# Patient Record
Sex: Male | Born: 1995
Health system: Southern US, Community
[De-identification: ages and names within clinical notes are randomized; demographics above are authoritative.]

## PROBLEM LIST (undated history)

## (undated) DIAGNOSIS — R630 Anorexia: Secondary | ICD-10-CM

## (undated) DIAGNOSIS — R6250 Unspecified lack of expected normal physiological development in childhood: Secondary | ICD-10-CM

## (undated) DIAGNOSIS — E063 Autoimmune thyroiditis: Secondary | ICD-10-CM

## (undated) DIAGNOSIS — E230 Hypopituitarism: Secondary | ICD-10-CM

## (undated) DIAGNOSIS — E079 Disorder of thyroid, unspecified: Secondary | ICD-10-CM

## (undated) DIAGNOSIS — F909 Attention-deficit hyperactivity disorder, unspecified type: Secondary | ICD-10-CM

## (undated) HISTORY — DX: Attention-deficit hyperactivity disorder, unspecified type: F90.9

## (undated) HISTORY — DX: Disorder of thyroid, unspecified: E07.9

## (undated) HISTORY — DX: Unspecified lack of expected normal physiological development in childhood: R62.50

## (undated) HISTORY — DX: Autoimmune thyroiditis: E06.3

## (undated) HISTORY — DX: Hypopituitarism: E23.0

## (undated) HISTORY — DX: Anorexia: R63.0

---
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
## 2014-04-23 IMAGING — CR DG BONE AGE
1 series · 1 of 1 positions shown · non-contrast
Comparison: 11/24/2010

CLINICAL DATA: Growth delay.

BONE AGE DETERMINATION
TECHNIQUE: AP radiographs of the hand and wrist are correlated
with the developmental standards of Greulich and Pyle.

[view not recorded]
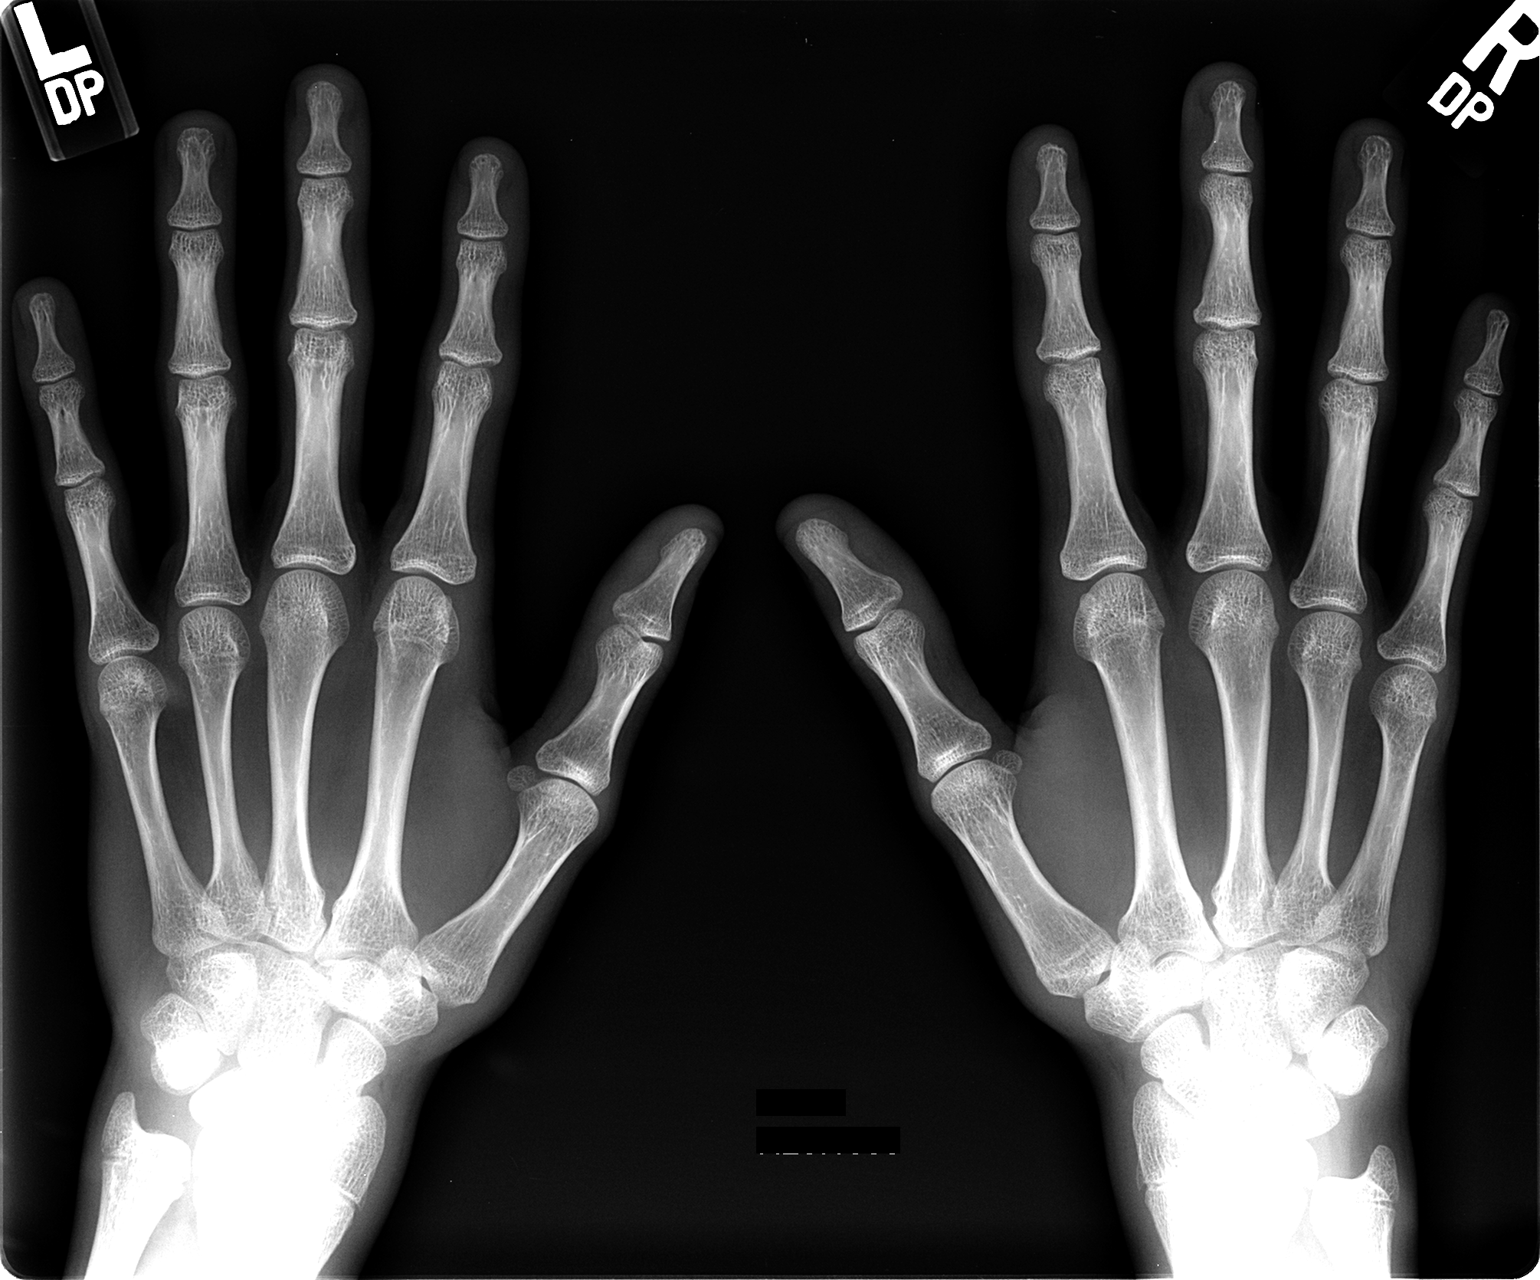

[1 of 1 positions shown; findings below may reference images not displayed]

FINDINGS: Chronologic age:  16  years, 2 months (date of birth
08/16/1995)

months
IMPRESSION: Skeletal age within a standard deviation of the chronologic age.
The only growth plates that remain open are in the distal radius
and ulna.

## 2019-10-15 ENCOUNTER — Ambulatory Visit: Payer: Self-pay

## 2019-10-15 ENCOUNTER — Other Ambulatory Visit: Payer: Self-pay

## 2019-10-15 ENCOUNTER — Ambulatory Visit: Payer: BC Managed Care – PPO | Admitting: Physician Assistant

## 2019-10-15 VITALS — BP 114/69 | HR 60 | Resp 18 | Ht 74.5 in | Wt 136.0 lb

## 2019-10-15 DIAGNOSIS — Z114 Encounter for screening for human immunodeficiency virus [HIV]: Secondary | ICD-10-CM

## 2019-10-15 DIAGNOSIS — Z8639 Personal history of other endocrine, nutritional and metabolic disease: Secondary | ICD-10-CM

## 2019-10-15 DIAGNOSIS — Z8659 Personal history of other mental and behavioral disorders: Secondary | ICD-10-CM

## 2019-10-15 DIAGNOSIS — Z1159 Encounter for screening for other viral diseases: Secondary | ICD-10-CM

## 2019-10-15 DIAGNOSIS — Z0000 Encounter for general adult medical examination without abnormal findings: Secondary | ICD-10-CM

## 2019-10-15 NOTE — Progress Notes (Signed)
New Patient Office Visit  Subjective:  Patient ID: Justin Reyes, male    DOB: 1996/01/05  Age: 24 y.o. MRN: [id number]  CC:  Chief Complaint  Patient presents with  . ADD    HPI Justin Reyes reports that he was treated for attention deficit as a child, but he has not been on occasion approximately 2012.  Reports that has noticed that he is having difficult quality focusing at work and does request to restart his medication.  Reports sleep is good, does participate in some exercise on the weekends, has coffee in the morning but otherwise drinks mostly water during the day.  Does endorse that his diet could be better, generally eats fast food for lunch every day.  Reports history of Hashimoto's thyroid, states that he never had to take medication for this.  States that he feels it was due to growth hormone therapy.  No other concerns at this time  Does vape  Has had one shot Phizer Covid vaccine 05/26/2019   Past Medical History:  Diagnosis Date  . ADHD (attention deficit hyperactivity disorder)   . Growth hormone deficiency (human) (HCC)   . Hypothyroidism, acquired, autoimmune   . Physical growth delay   . Poor appetite   . Thyroiditis, autoimmune     History reviewed. No pertinent surgical history.  Family History  Problem Relation Age of Onset  . Thyroid disease Mother   . Thyroid disease Maternal Aunt   . Cancer Maternal Grandmother   . Thyroid disease Maternal Grandfather   . Diabetes Neg Hx   . Kidney disease Neg Hx     Social History   Socioeconomic History  . Marital status: Single    Spouse name: Not on file  . Number of children: Not on file  . Years of education: Not on file  . Highest education level: Not on file  Occupational History  . Not on file  Tobacco Use  . Smoking status: Never Smoker  . Smokeless tobacco: Never Used  Substance and Sexual Activity  . Alcohol use: No  . Drug use: No  . Sexual activity: Never  Other Topics  Concern  . Not on file  Social History Narrative  . Not on file   Social Determinants of Health   Financial Resource Strain:   . Difficulty of Paying Living Expenses: Not on file  Food Insecurity:   . Worried About Programme researcher, broadcasting/film/video in the Last Year: Not on file  . Ran Out of Food in the Last Year: Not on file  Transportation Needs:   . Lack of Transportation (Medical): Not on file  . Lack of Transportation (Non-Medical): Not on file  Physical Activity:   . Days of Exercise per Week: Not on file  . Minutes of Exercise per Session: Not on file  Stress:   . Feeling of Stress : Not on file  Social Connections:   . Frequency of Communication with Friends and Family: Not on file  . Frequency of Social Gatherings with Friends and Family: Not on file  . Attends Religious Services: Not on file  . Active Member of Clubs or Organizations: Not on file  . Attends Banker Meetings: Not on file  . Marital Status: Not on file  Intimate Partner Violence:   . Fear of Current or Ex-Partner: Not on file  . Emotionally Abused: Not on file  . Physically Abused: Not on file  . Sexually Abused: Not on file  ROS Review of Systems  Constitutional: Negative.   HENT: Negative.   Eyes: Negative.   Respiratory: Negative.   Cardiovascular: Negative.   Gastrointestinal: Negative.  Negative for abdominal pain and constipation.  Endocrine: Negative.   Genitourinary: Negative.   Musculoskeletal: Negative.   Skin: Negative.   Allergic/Immunologic: Negative.   Neurological: Negative.  Negative for dizziness and headaches.  Hematological: Negative.   Psychiatric/Behavioral: Negative for dysphoric mood, self-injury, sleep disturbance and suicidal ideas. The patient is not nervous/anxious.     Objective:   Today's Vitals: BP 114/69 (BP Location: Left Arm, Patient Position: Sitting, Cuff Size: Normal)   Pulse 60   Resp 18   Ht 6' 2.5" (1.892 m)   Wt 136 lb (61.7 kg)   SpO2 100%    BMI 17.23 kg/m   Physical Exam Vitals and nursing note reviewed.  Constitutional:      Appearance: Normal appearance. He is normal weight.  HENT:     Head: Normocephalic and atraumatic.     Right Ear: Tympanic membrane, ear canal and external ear normal.     Left Ear: Tympanic membrane, ear canal and external ear normal.     Nose: Nose normal.     Mouth/Throat:     Mouth: Mucous membranes are moist.     Pharynx: Oropharynx is clear.  Eyes:     Extraocular Movements: Extraocular movements intact.     Conjunctiva/sclera: Conjunctivae normal.     Pupils: Pupils are equal, round, and reactive to light.  Cardiovascular:     Rate and Rhythm: Normal rate and regular rhythm.     Pulses: Normal pulses.     Heart sounds: Normal heart sounds.  Pulmonary:     Effort: Pulmonary effort is normal.     Breath sounds: Normal breath sounds.  Abdominal:     General: Abdomen is flat. Bowel sounds are normal.     Palpations: Abdomen is soft.  Musculoskeletal:        General: Normal range of motion.     Cervical back: Normal range of motion and neck supple.  Skin:    General: Skin is warm and dry.  Neurological:     General: No focal deficit present.     Mental Status: He is alert and oriented to person, place, and time.  Psychiatric:        Mood and Affect: Mood normal.        Behavior: Behavior normal.        Thought Content: Thought content normal.        Judgment: Judgment normal.     Assessment & Plan:   Problem List Items Addressed This Visit    None    Visit Diagnoses    Wellness examination    -  Primary   Relevant Orders   CBC with Differential/Platelet   Comp. Metabolic Panel (12)   History of ADHD       Relevant Orders   Ambulatory referral to Psychiatry   History of Hashimoto thyroiditis       Relevant Orders   TSH   Screening for HIV without presence of risk factors       Relevant Orders   HIV antibody (with reflex)   Encounter for HCV screening test for low  risk patient       Relevant Orders   HCV Ab w/Rflx to Verification      Outpatient Encounter Medications as of 10/15/2019  Medication Sig  . amphetamine-dextroamphetamine (ADDERALL) 15 MG tablet Take  15 mg by mouth daily.    . Multiple Vitamin (MULTIVITAMIN) tablet Take 1 tablet by mouth daily.    . Somatropin (NORDITROPIN NORDIFLEX PEN [place]) Inject 5.9 mg into the skin daily.  (Patient not taking: Reported on 10/15/2019)   No facility-administered encounter medications on file as of 10/15/2019.  1. Wellness examination Patient given appointment to establish care with Dr. Earlene Plater at [facility] with Differential/Platelet - Comp. Metabolic Panel (12)  2. History of ADHD Patient education given on lifestyle modifications to help with attention difficulties, including increasing exercise, daily vitamin, maintaining schedule, maintaining good sleep hygiene.  Referral to psychiatry for further evaluation - Ambulatory referral to Psychiatry  3. History of Hashimoto thyroiditis  - TSH  4. Screening for HIV without presence of risk factors  - HIV antibody (with reflex)  5. Encounter for HCV screening test for low risk patient  - HCV Ab w/Rflx to Verification Patient education given on where he can go to receive second dose of Pfizer vaccine   I have reviewed the patient's medical history (PMH, PSH, Social History, Family History, Medications, and allergies) , and have been updated if relevant. I spent 30 minutes reviewing chart and  face to face time with patient.     Follow-up: Return if symptoms worsen or fail to improve.   Kasandra Knudsen Mayers, PA-C

## 2019-10-15 NOTE — Patient Instructions (Signed)
Thank you for allowing [place] to take care of you today.  We will call you with your lab results.  Please let us know if there is anything else we can do for you  Roney Jaffe, PA-C Physician Assistant [facility] [email or web address]    Attention Deficit Hyperactivity Disorder, Adult Attention deficit hyperactivity disorder (ADHD) is a mental health disorder that starts during childhood (neurodevelopmental disorder). For many people with ADHD, the disorder continues into the adult years. Treatment can help you manage your symptoms. What are the causes? The exact cause of ADHD is not known. Most experts believe genetics and environmental factors contribute to ADHD. What increases the risk? The following factors may make you more likely to develop this condition:  Having a family history of ADHD.  Being male.  Being born to a mother who smoked or drank alcohol during pregnancy.  Being exposed to lead or other toxins in the womb or early in life.  Being born before 37 weeks of pregnancy (prematurely) or at a low birth weight.  Having experienced a brain injury. What are the signs or symptoms? Symptoms of this condition depend on the type of ADHD. The two main types are inattentive and hyperactive-impulsive. Some people may have symptoms of both types. Symptoms of the inattentive type include:  Difficulty paying attention.  Making careless mistakes.  Not following instructions.  Being disorganized.  Avoiding tasks that require time and attention.  Losing and forgetting things.  Being easily distracted. Symptoms of the hyperactive-impulsive type include:  Restlessness.  Talking too much.  Interrupting.  Difficulty with: ? Sitting still. ? Feeling motivated. ? Relaxing. ? Waiting in line or waiting for a turn. In adults, this condition may lead to certain problems, such as:  Keeping jobs.  Performing tasks  at work.  Having stable relationships.  Being on time or keeping to a schedule. How is this diagnosed? This condition is diagnosed based on your current symptoms and your history of symptoms. The diagnosis can be made by a health care provider such as a primary care provider or a mental health care specialist. Your health care provider may use a symptom checklist or a behavior rating scale to evaluate your symptoms. He or she may also want to talk with people who have observed your behaviors throughout your life. How is this treated? This condition can be treated with medicines and behavior therapy. Medicines may be the best option to reduce impulsive behaviors and improve attention. Your health care provider may recommend:  Stimulant medicines. These are the most common medicines used for adult ADHD. They affect certain chemicals in the brain (neurotransmitters) and improve your ability to control your symptoms.  A non-stimulant medicine for adult ADHD (atomoxetine). This medicine increases a neurotransmitter called norepinephrine. It may take weeks to months to see effects from this medicine. Counseling and behavioral management are also important for treating ADHD. Counseling is often used along with medicine. Your health care provider may suggest:  Cognitive behavioral therapy (CBT). This type of therapy teaches you to replace negative thoughts and actions with positive thoughts and actions. When used as part of ADHD treatment, this therapy may also include: ? Coping strategies for organization, time management, impulse control, and stress reduction. ? Mindfulness and meditation training.  Behavioral management. You may work with a Psychologist, occupational who is specially trained to help people with ADHD manage and organize activities and function more effectively. Follow these instructions at home: Medicines   Take over-the-counter and  prescription medicines only as told by your health care  provider.  Talk with your health care provider about the possible side effects of your medicines and how to manage them. Lifestyle   Do not use drugs.  Do not drink alcohol if: ? Your health care provider tells you not to drink. ? You are pregnant, may be pregnant, or are planning to become pregnant.  If you drink alcohol: ? Limit how much you use to:  0-1 drink a day for women.  0-2 drinks a day for men. ? Be aware of how much alcohol is in your drink. In the [place] one drink equals one 12 oz bottle of beer (355 mL), one 5 oz glass of wine (148 mL), or one 1 oz glass of hard liquor (44 mL).  Get enough sleep.  Eat a healthy diet.  Exercise regularly. Exercise can help to reduce stress and anxiety. General instructions  Learn as much as you can about adult ADHD, and work closely with your health care providers to find the treatments that work best for you.  Follow the same schedule each day.  Use reminder devices like notes, calendars, and phone apps to stay on time and organized.  Keep all follow-up visits as told by your health care provider and therapist. This is important. Where to find more information A health care provider may be able to recommend resources that are available online or over the phone. You could start with:  Attention Deficit Disorder Association (ADDA): http://davis-dillon.net/  [employer] of Mental Health [facility]): http://www.maynard.net/ Contact a health care provider if:  Your symptoms continue to cause problems.  You have side effects from your medicine, such as: ? Repeated muscle twitches, coughing, or speech outbursts. ? Sleep problems. ? Loss of appetite. ? Dizziness. ? Unusually fast heartbeat. ? Stomach pains. ? Headaches.  You are struggling with anxiety, depression, or substance abuse. Get help right away if you:  Have a severe reaction to a medicine. If you ever feel like you may hurt yourself or others, or have thoughts about taking your  own life, get help right away. You can go to the nearest emergency department or call:  Your local emergency services (911 in the [place]  A suicide crisis helpline, such as the National Suicide Prevention Lifeline at 308-453-9928. This is open 24 hours a day. Summary  ADHD is a mental health disorder that starts during childhood (neurodevelopmental disorder) and often continues into the adult years.  The exact cause of ADHD is not known. Most experts believe genetics and environmental factors contribute to ADHD.  There is no cure for ADHD, but treatment with medicine, cognitive behavioral therapy, or behavioral management can help you manage your condition. This information is not intended to replace advice given to you by your health care provider. Make sure you discuss any questions you have with your health care provider. Document Revised: 06/05/2018 Document Reviewed: 06/05/2018 Elsevier Patient Education  2020 [employer].

## 2019-10-16 ENCOUNTER — Telehealth: Payer: Self-pay | Admitting: *Deleted

## 2019-10-16 LAB — TSH: TSH: 3.16 u[IU]/mL (ref 0.450–4.500)

## 2019-10-16 LAB — CBC WITH DIFFERENTIAL/PLATELET
Basophils Absolute: 0.1 10*3/uL (ref 0.0–0.2)
Basos: 1 %
EOS (ABSOLUTE): 0.2 10*3/uL (ref 0.0–0.4)
Eos: 4 %
Hematocrit: 44.5 % (ref 37.5–51.0)
Hemoglobin: 14.7 g/dL (ref 13.0–17.7)
Immature Grans (Abs): 0 10*3/uL (ref 0.0–0.1)
Immature Granulocytes: 0 %
Lymphocytes Absolute: 1.6 10*3/uL (ref 0.7–3.1)
Lymphs: 34 %
MCH: 29.5 pg (ref 26.6–33.0)
MCHC: 33 g/dL (ref 31.5–35.7)
MCV: 89 fL (ref 79–97)
Monocytes Absolute: 0.4 10*3/uL (ref 0.1–0.9)
Monocytes: 7 %
Neutrophils Absolute: 2.5 10*3/uL (ref 1.4–7.0)
Neutrophils: 54 %
Platelets: 214 10*3/uL (ref 150–450)
RBC: 4.99 x10E6/uL (ref 4.14–5.80)
RDW: 12.3 % (ref 11.6–15.4)
WBC: 4.7 10*3/uL (ref 3.4–10.8)

## 2019-10-16 LAB — COMP. METABOLIC PANEL (12)
AST: 21 IU/L (ref 0–40)
Albumin/Globulin Ratio: 2.1 (ref 1.2–2.2)
Albumin: 4.6 g/dL (ref 4.1–5.2)
Alkaline Phosphatase: 34 IU/L — ABNORMAL LOW (ref 44–121)
BUN/Creatinine Ratio: 11 (ref 9–20)
BUN: 10 mg/dL (ref 6–20)
Bilirubin Total: 0.3 mg/dL (ref 0.0–1.2)
Calcium: 9.8 mg/dL (ref 8.7–10.2)
Chloride: 105 mmol/L (ref 96–106)
Creatinine, Ser: 0.91 mg/dL (ref 0.76–1.27)
GFR calc Af Amer: 134 mL/min/{1.73_m2} (ref >59)
GFR calc non Af Amer: 116 mL/min/{1.73_m2} (ref >59)
Globulin, Total: 2.2 g/dL (ref 1.5–4.5)
Glucose: 87 mg/dL (ref 65–99)
Potassium: 4.7 mmol/L (ref 3.5–5.2)
Sodium: 143 mmol/L (ref 134–144)
Total Protein: 6.8 g/dL (ref 6.0–8.5)

## 2019-10-16 LAB — HCV INTERPRETATION

## 2019-10-16 LAB — HIV ANTIBODY (ROUTINE TESTING W REFLEX): HIV Screen 4th Generation wRfx: NONREACTIVE

## 2019-10-16 LAB — HCV AB W/RFLX TO VERIFICATION: HCV Ab: <0.1 s/co ratio (ref 0.0–0.9)

## 2019-10-16 NOTE — Telephone Encounter (Signed)
-----   Message from Roney Jaffe, [place] sent at 10/16/2019  3:22 PM EDT ----- Please let patient now his thyroid and kidneys were within normal limits, he did have one slightly abnormal liver function test, however nothing of concern, would just want to have this repeated in the next 6 months to a year.  Screening for hepatitis C and HIV were both negative

## 2019-10-16 NOTE — Telephone Encounter (Signed)
Patients phone is off at this moment. A mychart message was also sent from PA to review results with patient.

## 2019-10-22 DIAGNOSIS — Z20828 Contact with and (suspected) exposure to other viral communicable diseases: Secondary | ICD-10-CM | POA: Diagnosis not present

## 2020-01-16 NOTE — Patient Instructions (Signed)
Thank you for choosing Primary Care at [facility] to be your medical home!    Justin Reyes was seen by De Hollingshead, DO today.   Justin Reyes's primary care provider is Marcy Siren, DO.   For the best care possible, you should try to see Marcy Siren, DO whenever you come to the clinic.   We look forward to seeing you again soon!  If you have any questions about your visit today, please call us at [phone] or feel free to reach your primary care provider via MyChart.

## 2020-01-17 ENCOUNTER — Encounter: Payer: Self-pay | Admitting: Internal Medicine

## 2020-01-17 ENCOUNTER — Other Ambulatory Visit: Payer: Self-pay

## 2020-01-17 ENCOUNTER — Telehealth (INDEPENDENT_AMBULATORY_CARE_PROVIDER_SITE_OTHER): Payer: BC Managed Care – PPO | Admitting: Internal Medicine

## 2020-01-17 DIAGNOSIS — F909 Attention-deficit hyperactivity disorder, unspecified type: Secondary | ICD-10-CM | POA: Diagnosis not present

## 2020-01-17 DIAGNOSIS — Z7689 Persons encountering health services in other specified circumstances: Secondary | ICD-10-CM | POA: Diagnosis not present

## 2020-01-17 DIAGNOSIS — Z7189 Other specified counseling: Secondary | ICD-10-CM | POA: Diagnosis not present

## 2020-01-17 NOTE — Progress Notes (Signed)
Virtual Visit via Telephone Note  I connected with Justin Reyes, on 01/17/2020 at 9:01 AM by telephone due to the COVID-19 pandemic and verified that I am speaking with the correct person using two identifiers.   Consent: I discussed the limitations, risks, security and privacy concerns of performing an evaluation and management service by telephone and the availability of in person appointments. I also discussed with the patient that there may be a patient responsible charge related to this service. The patient expressed understanding and agreed to proceed.   Location of Patient: Home   Location of Provider: Clinic    Persons participating in Telemedicine visit: Mannix Kroeker [facility] Dr. Earlene Plater      History of Present Illness: Patient has a visit to establish care. Has not been seen by PCP for several years. Patient has a PMH of ADHD--not currently on Adderall. Also reports has a history of Hashimoto's disease and was taking HGH for a while. Has been at least 6 years since this was prescribed. No past surgical history.  Would like to be referred for evaluation for adult ADHD. Works for [employer]. Trouble focusing at work. Occasionally has trouble staying on task at home as well.     Past Medical History:  Diagnosis Date  . ADHD (attention deficit hyperactivity disorder)   . Growth hormone deficiency (human) (HCC)   . Hypothyroidism, acquired, autoimmune   . Physical growth delay   . Poor appetite   . Thyroiditis, autoimmune    Allergies  Allergen Reactions  . Shrimp [Shellfish Allergy] Anaphylaxis  . Other Itching and Swelling    Current Outpatient Medications on File Prior to Visit  Medication Sig Dispense Refill  . Multiple Vitamin (MULTIVITAMIN) tablet Take 1 tablet by mouth daily.    Marland Kitchen amphetamine-dextroamphetamine (ADDERALL) 15 MG tablet Take 15 mg by mouth daily.   (Patient not taking: Reported on 01/17/2020)     No current  facility-administered medications on file prior to visit.    Observations/Objective: NAD. Speaking clearly.  Work of breathing normal.  Alert and oriented. Mood appropriate.   Assessment and Plan: 1. Encounter to establish care Reviewed patient's PMH, social history, surgical history, and medications.  Is overdue for annual exam, screening blood work, and health maintenance topics. Have asked patient to return for visit to address these items.   2. Attention deficit hyperactivity disorder (ADHD), unspecified ADHD type Referral to psych for h/o childhood ADHD and evaluation of adult ADHD and need for Adderall.  - Ambulatory referral to Psychiatry   Follow Up Instructions: Annual exam    I discussed the assessment and treatment plan with the patient. The patient was provided an opportunity to ask questions and all were answered. The patient agreed with the plan and demonstrated an understanding of the instructions.   The patient was advised to call back or seek an in-person evaluation if the symptoms worsen or if the condition fails to improve as anticipated.     I provided 8 minutes total of non-face-to-face time during this encounter including median intraservice time, reviewing previous notes, investigations, ordering medications, medical decision making, coordinating care and patient verbalized understanding at the end of the visit.    Marcy Siren, D.O. Primary Care at [facility]  01/17/2020, 9:01 AM

## 2020-03-17 ENCOUNTER — Encounter: Payer: BC Managed Care – PPO | Admitting: Internal Medicine

## 2020-03-24 ENCOUNTER — Encounter: Payer: BC Managed Care – PPO | Admitting: Internal Medicine

## 2020-04-16 ENCOUNTER — Encounter: Payer: BC Managed Care – PPO | Admitting: Internal Medicine

## 2020-07-04 DIAGNOSIS — Z20822 Contact with and (suspected) exposure to covid-19: Secondary | ICD-10-CM | POA: Diagnosis not present

## 2021-07-01 DIAGNOSIS — J029 Acute pharyngitis, unspecified: Secondary | ICD-10-CM | POA: Diagnosis not present

## 2021-07-01 DIAGNOSIS — Z20822 Contact with and (suspected) exposure to covid-19: Secondary | ICD-10-CM | POA: Diagnosis not present

## 2021-07-01 DIAGNOSIS — Z03818 Encounter for observation for suspected exposure to other biological agents ruled out: Secondary | ICD-10-CM | POA: Diagnosis not present

## 2021-07-01 DIAGNOSIS — H66002 Acute suppurative otitis media without spontaneous rupture of ear drum, left ear: Secondary | ICD-10-CM | POA: Diagnosis not present

## 2022-04-17 DIAGNOSIS — M940 Chondrocostal junction syndrome [Tietze]: Secondary | ICD-10-CM | POA: Diagnosis not present

## 2022-05-13 DIAGNOSIS — M940 Chondrocostal junction syndrome [Tietze]: Secondary | ICD-10-CM | POA: Diagnosis not present
